# Patient Record
Sex: Male | Born: 1935 | Race: White | Hispanic: No | Marital: Married | Smoking: Former smoker
Health system: Southern US, Community
[De-identification: ages and names within clinical notes are randomized; demographics above are authoritative.]

## PROBLEM LIST (undated history)

## (undated) DIAGNOSIS — I10 Essential (primary) hypertension: Secondary | ICD-10-CM

## (undated) HISTORY — PX: HERNIA REPAIR: SHX51

## (undated) HISTORY — DX: Essential (primary) hypertension: I10

---

## 1997-09-21 ENCOUNTER — Ambulatory Visit (HOSPITAL_COMMUNITY): Admission: RE | Admit: 1997-09-21 | Discharge: 1997-09-21 | Payer: Self-pay | Admitting: Internal Medicine

## 2002-12-29 ENCOUNTER — Ambulatory Visit (HOSPITAL_COMMUNITY): Admission: RE | Admit: 2002-12-29 | Discharge: 2002-12-29 | Payer: Self-pay | Admitting: Gastroenterology

## 2002-12-29 ENCOUNTER — Encounter (INDEPENDENT_AMBULATORY_CARE_PROVIDER_SITE_OTHER): Payer: Self-pay | Admitting: Specialist

## 2006-07-21 ENCOUNTER — Encounter: Admission: RE | Admit: 2006-07-21 | Discharge: 2006-07-21 | Payer: Self-pay | Admitting: Internal Medicine

## 2006-11-27 ENCOUNTER — Ambulatory Visit (HOSPITAL_COMMUNITY): Admission: RE | Admit: 2006-11-27 | Discharge: 2006-11-27 | Payer: Self-pay | Admitting: *Deleted

## 2007-07-24 ENCOUNTER — Encounter: Admission: RE | Admit: 2007-07-24 | Discharge: 2007-07-24 | Payer: Self-pay | Admitting: Internal Medicine

## 2010-10-11 NOTE — Op Note (Signed)
NAME:  George Carey, George Carey                  ACCOUNT NO.:  [id number]   MEDICAL RECORD NO.:  [id number]          PATIENT TYPE:  AMB   LOCATION:  DAY                          FACILITY:  [facility]   PHYSICIAN:  Alfonse Ras, MD   DATE OF BIRTH:  [birth year]   DATE OF PROCEDURE:  11/27/2006  DATE OF DISCHARGE:                               OPERATIVE REPORT   PREOPERATIVE DIAGNOSIS:  Right inguinal hernia and umbilical hernia.   POSTOPERATIVE DIAGNOSIS:  Right inguinal hernia and umbilical hernia.   PROCEDURE:  Right inguinal hernia repair with mesh and umbilical hernia  repair.   ANESTHESIA:  General.   SURGEON:  Alfonse Ras, M.D.   DESCRIPTION:  The patient was taken to the operating room and placed in  a supine position. After general anesthesia was induced using laryngeal  mask, the right groin and entire abdomen were prepped and draped in a  normal sterile fashion. Using an oblique incision over the right lower  inguinal region, I dissected down to the external oblique fascia.  This  was opened along its fibers down to the external ring.  The spermatic  cord was identified, mobilized to the pubic tubercle, and a Penrose  drain was passed around it.  The cord was inspected and there was no  evidence of an indirect hernia.  The patient did have a direct hernia.  This was quite small, easily reducible, and was closed with interrupted  Surgilon sutures approximating the inguinal ligament and Cooper's  ligament to the transversalis fascia.  An onlay mesh was then placed  over it of Atrium 3 by 6 size, split and brought out lateral to the  internal ring.  This was tacked in place using running 2-0 Prolene  sutures, starting at the pubic tubercle, running along the inguinal  ligament, brought out lateral to the internal ring.  It was tacked  superiorly to the transversalis fascia.  Adequate hemostasis was  assured.  The external oblique fascia was closed with a running 3-0  Vicryl suture.   The skin was closed with staples.   Then, I turned my attention to the umbilicus. A short transverse  infraumbilical incision was made.  A very small hernia defect was found.  The hernia sac was dissected free and reduced into the abdominal cavity.  The defect, itself, was only about 1 to 1.5 cm and was closed with  interrupted 0 Surgilon sutures.  The skin was then closed with staples  after adequate hemostasis was ensured. Sterile dressings were applied.  The patient tolerated the procedure well and went to the PACU in good  condition.      Alfonse Ras, MD  Electronically Signed     KRE/MEDQ  D:  11/27/2006  T:  11/27/2006  Job:  [id number]

## 2010-10-14 NOTE — Op Note (Signed)
   NAME:  George Carey, George Carey                              ACCOUNT NO.:  [id number]   MEDICAL RECORD NO.:  [id number]                   PATIENT TYPE:  AMB   LOCATION:  ENDO                                 FACILITY:  [facility]   PHYSICIAN:  James L. Malon Kindle., M.D.          DATE OF BIRTH:  [birth year]   DATE OF PROCEDURE:  12/29/2002  DATE OF DISCHARGE:                                 OPERATIVE REPORT   PROCEDURE:  Colonoscopy and polypectomy.   MEDICATIONS:  Fentanyl 50 mcg, Versed 4 mg IV.   INDICATIONS:  Colon cancer screening.   DESCRIPTION OF PROCEDURE:  The procedure had been explained to the patient  and consent obtained.  With the patient was placed in the left lateral  decubitus position, the Olympus pediatric adjustable colonoscope was  inserted and advanced.  We were able to advance easily to the cecum.  The  ileocecal valve and appendiceal orifice seen.  The scope was withdrawn and  the cecum and ascending colon were seen well.  There were two polyps seen in  the ascending colon, about 5-10 cm apart, each 0.5 cm and sessile. Each of  these was removed with the snare and sucked through the scope.  The scope  was withdrawn.  The patient tolerated the procedure well.  No other polyps  were seen throughout the remainder of the colon.  There were no further  polyps seen.  There was no significant diverticular disease.  The patient  was monitored with low-flow oxygen with pulse oximetry and cardiac  monitoring throughout the procedure.   ASSESSMENT:  Polyps removed from the ascending colon.   PLAN:  Will check pathology.  Routine post polypectomy instructions. Will  repeat the procedure in three years.                                                James L. Malon Kindle., M.D.    Waldron Session  D:  12/29/2002  T:  12/29/2002  Job:  [id number]   cc:   Antony Madura, M.D.  [address], [address]  [place]  [place] [address]  Fax: [phone]

## 2011-03-15 LAB — URINALYSIS, ROUTINE W REFLEX MICROSCOPIC
Bilirubin Urine: NEGATIVE
Ketones, ur: NEGATIVE
Specific Gravity, Urine: 1.017
Urobilinogen, UA: 0.2
pH: 7.5

## 2011-03-15 LAB — DIFFERENTIAL
Basophils Absolute: 0
Basophils Relative: 0
Lymphocytes Relative: 31
Lymphs Abs: 2
Monocytes Absolute: 0.5

## 2011-03-15 LAB — BASIC METABOLIC PANEL
BUN: 10
CO2: 28
Chloride: 106
Creatinine, Ser: 0.86
Glucose, Bld: 145 — ABNORMAL HIGH

## 2011-03-15 LAB — CBC: MCV: 87.9

## 2011-08-31 ENCOUNTER — Ambulatory Visit
Admission: RE | Admit: 2011-08-31 | Discharge: 2011-08-31 | Disposition: A | Discharge status: Home / Self Care | Payer: Medicare Other | Source: Ambulatory Visit | Attending: Internal Medicine | Admitting: Internal Medicine

## 2011-08-31 ENCOUNTER — Other Ambulatory Visit: Payer: Self-pay | Admitting: Internal Medicine

## 2011-08-31 DIAGNOSIS — M25569 Pain in unspecified knee: Secondary | ICD-10-CM

## 2011-08-31 DIAGNOSIS — M25559 Pain in unspecified hip: Secondary | ICD-10-CM

## 2013-11-14 ENCOUNTER — Ambulatory Visit
Admission: RE | Admit: 2013-11-14 | Discharge: 2013-11-14 | Disposition: A | Discharge status: Home / Self Care | Payer: Medicare Other | Source: Ambulatory Visit | Attending: Internal Medicine | Admitting: Internal Medicine

## 2013-11-14 ENCOUNTER — Other Ambulatory Visit: Payer: Self-pay | Admitting: Internal Medicine

## 2013-11-14 DIAGNOSIS — R05 Cough: Secondary | ICD-10-CM

## 2013-11-14 DIAGNOSIS — R059 Cough, unspecified: Secondary | ICD-10-CM

## 2015-05-30 HISTORY — PX: HERNIA REPAIR: SHX51

## 2015-09-21 ENCOUNTER — Ambulatory Visit
Admission: RE | Admit: 2015-09-21 | Discharge: 2015-09-21 | Disposition: A | Discharge status: Home / Self Care | Payer: Medicare Other | Source: Ambulatory Visit | Attending: Internal Medicine | Admitting: Internal Medicine

## 2015-09-21 ENCOUNTER — Other Ambulatory Visit: Payer: Self-pay | Admitting: Internal Medicine

## 2015-09-21 DIAGNOSIS — R059 Cough, unspecified: Secondary | ICD-10-CM

## 2015-09-21 DIAGNOSIS — R05 Cough: Secondary | ICD-10-CM

## 2015-11-01 ENCOUNTER — Ambulatory Visit: Payer: Self-pay | Admitting: Surgery

## 2015-11-01 NOTE — H&P (Signed)
  History of Present Illness George Carey(George Carey K. Karinne Schmader MD; 11/01/2015 11:58 AM) Patient words: hernia.  The patient is a 82 year old male who presents with an inguinal hernia. Referred by Dr. Zenaida Deedon Carey for left inguinal hernia This is an 82 year old male in relatively good health who is status post right inguinal hernia repair with mesh and umbilical hernia repair with suture 2008 by Dr. Colin Carey. Recently the patient had some type of respiratory issues and had severe coughing. In March of this year he began noticing a bulge in his left groin with some discomfort. This bulge has enlarged. It is spontaneously reducible when he is supine. He denies any obstructive symptoms. The patient is retired but is still reasonably active. He would like to have this repaired.   Allergies (George Eversole, LPN; 1/6/10966/09/2015 04:5410:16 AM) No Known Drug Allergies 11/01/2015  Medication History (George Eversole, LPN; 0/9/81196/09/2015 14:7810:18 AM) Lisinopril (10MG  Tablet, Oral) Active. Pioglitazone HCl (15MG  Tablet, Oral) Active. Atorvastatin Calcium (40MG  Tablet, Oral) Active. Medications Reconciled    Vitals (George Eversole LPN; 2/9/56216/09/2015 30:8610:16 AM) 11/01/2015 10:15 AM Weight: 161.8 lb Height: 71in Body Surface Area: 1.93 m Body Mass Index: 22.57 kg/m  Temp.: 97.77F(Oral)  Pulse: 74 (Regular)  BP: 130/60 (Sitting, Left Arm, Standard)      Physical Exam George Carey(George Carey K. Modelle Vollmer MD; 11/01/2015 11:59 AM)  The physical exam findings are as follows: Note:WDWN in NAD HEENT: EOMI, sclera anicteric Neck: No masses, no thyromegaly Lungs: CTA bilaterally; normal respiratory effort CV: Regular rate and rhythm; no murmurs Abd: +bowel sounds, soft, non-tender, no masses GU: Bilateral descended testes, no testicular masses Well-healed right inguinal incision with no sign of recurrent hernia Visible left inguinal bulge. This is easily reducible. It enlarges with Valsalva maneuver. Ext: Well-perfused; no edema Skin: Warm,  dry; no sign of jaundice    Assessment & Plan George Carey(George Carey K. Dariella Gillihan MD; 11/01/2015 10:40 AM)  LEFT INGUINAL HERNIA (K40.90)  Current Plans Schedule for Surgery - left inguinal hernia repair with mesh. The surgical procedure has been discussed with the patient. Potential risks, benefits, alternative treatments, and expected outcomes have been explained. All of the patient's questions at this time have been answered. The likelihood of reaching the patient's treatment goal is good. The patient understand the proposed surgical procedure and wishes to proceed.  George ArmsMatthew K. Corliss Skainssuei, MD, [facility]  General/ Trauma Surgery  11/01/2015 11:59 AM

## 2016-09-22 ENCOUNTER — Ambulatory Visit (INDEPENDENT_AMBULATORY_CARE_PROVIDER_SITE_OTHER): Payer: Medicare Other | Admitting: Neurology

## 2016-09-22 ENCOUNTER — Encounter: Payer: Self-pay | Admitting: Neurology

## 2016-09-22 VITALS — BP 164/70 | HR 60 | Ht 71.0 in | Wt 164.0 lb

## 2016-09-22 DIAGNOSIS — E538 Deficiency of other specified B group vitamins: Secondary | ICD-10-CM

## 2016-09-22 DIAGNOSIS — R269 Unspecified abnormalities of gait and mobility: Secondary | ICD-10-CM | POA: Diagnosis not present

## 2016-09-22 NOTE — Patient Instructions (Signed)
   We will check blood work today and get MRI of the brain. 

## 2016-09-22 NOTE — Progress Notes (Signed)
Reason for visit: Gait disorder  Referring physician: Dr. Hilda Carey is a 83 y.o. male  History of present illness:  Mr. George Carey is an 83 year old right-handed white male with a history of hypertension who has noted onset of some problems with gait instability that began in January 2018. The patient believes that the problem began relatively suddenly. He developed some decreased hearing in the left ear, he was seen by Dr. Narda Carey from ENT who found fluid behind the eardrum and this was drained. This helped the hearing slightly, but the hearing has not returned to normal. The patient has noted intermittent problems with gait instability with a tendency to veer to the left. He has not had any falls but he feels like he is going to fall. The patient denies any dizziness or vertigo. He denies any numbness or weakness of the extremities. He has not had any headache, he denies any significant problems with neck pain or severe low back pain. He has noted some problems with memory over the last 6 months. He denies issues controlling the bowels or the bladder. He has had an increase in his blood pressure medications recently, but not at the time that the gait instability began. He is sent to this office for further evaluation.  Past Medical History:  Diagnosis Date  . High blood pressure     Past Surgical History:  Procedure Laterality Date  . HERNIA REPAIR Left 2017  . HERNIA REPAIR Right Around 2011    History reviewed. No pertinent family history.  Social history:  does not have a smoking history on file. He has never used smokeless tobacco. His alcohol and drug histories are not on file.  Medications:  Prior to Admission medications   Medication Sig Start Date End Date Taking? Authorizing Provider  atorvastatin (LIPITOR) 40 MG tablet Take 40 mg by mouth daily. 03/31/14  Yes Historical Provider, MD  hydrochlorothiazide (MICROZIDE) 12.5 MG capsule Take 12.5 mg by mouth daily.   08/29/16  Yes Historical Provider, MD  lisinopril (PRINIVIL,ZESTRIL) 10 MG tablet Take 20 mg by mouth daily.  06/16/16  Yes Historical Provider, MD  pioglitazone (ACTOS) 15 MG tablet Take 15 mg by mouth daily.  04/06/14  Yes Historical Provider, MD     Not on File  ROS:  Out of a complete 14 system review of symptoms, the patient complains only of the following symptoms, and all other reviewed systems are negative.  Hearing loss Snoring  Blood pressure (!) 164/70, pulse 60, height  (1.803 m), weight 164 lb (74.4 kg).   Blood pressure, right arm, sitting is 188/88. Blood pressure, right arm, standing is 142/80.   Physical Exam  General: The patient is alert and cooperative at the time of the examination.  Eyes: Pupils are equal, round, and reactive to light. Discs are flat bilaterally.  Neck: The neck is supple, no carotid bruits are noted.  Respiratory: The respiratory examination is clear.  Cardiovascular: The cardiovascular examination reveals a regular rate and rhythm, no obvious murmurs or rubs are noted.  Skin: Extremities are without significant edema.  Neurologic Exam  Mental status: The patient is alert and oriented x 3 at the time of the examination. The patient has apparent normal recent and remote memory, with an apparently normal attention span and concentration ability.  Cranial nerves: Facial symmetry is present. There is good sensation of the face to pinprick and soft touch bilaterally. The strength of the facial muscles and  the muscles to head turning and shoulder shrug are normal bilaterally. Speech is well enunciated, no aphasia or dysarthria is noted. Extraocular movements are full. Visual fields are full. The tongue is midline, and the patient has symmetric elevation of the soft palate. No obvious hearing deficits are noted.  Motor: The motor testing reveals 5 over 5 strength of all 4 extremities. Good symmetric motor tone is noted throughout.  Sensory:  Sensory testing is intact to pinprick, soft touch, vibration sensation, and position sense on all 4 extremities. No evidence of extinction is noted.  Coordination: Cerebellar testing reveals good finger-nose-finger and heel-to-shin bilaterally.  Gait and station: Gait is normal. Tandem gait is normal. Romberg is negative. No drift is seen.  Reflexes: Deep tendon reflexes are symmetric and normal bilaterally. Toes are downgoing bilaterally.   Assessment/Plan:  1. Reported gait disturbance  2. Hypertension  3. Reported memory disturbance  The patient appears to have a relatively normal gait while examined today. He indicates that the tendency to veer to the left is intermittent in nature. The patient does drop over 45 points from sitting to standing, but he is significantly hypertensive. The patient will be set up for blood work today, he will have MRI evaluation of the brain, and he will follow-up in about 4 months. The patient could potentially benefit from physical therapy in the future.  George Palau MD 09/22/2016 11:32 AM  [facility] [address] [place], [place] [address]  Phone [phone] Fax [phone]

## 2016-09-23 LAB — RPR: RPR Ser Ql: NONREACTIVE

## 2016-09-23 LAB — SEDIMENTATION RATE: SED RATE: 2 mm/h (ref 0–30)

## 2016-09-23 LAB — VITAMIN B12: VITAMIN B 12: 674 pg/mL (ref 232–1245)

## 2016-10-04 ENCOUNTER — Ambulatory Visit
Admission: RE | Admit: 2016-10-04 | Discharge: 2016-10-04 | Disposition: A | Discharge status: Home / Self Care | Payer: Medicare Other | Source: Ambulatory Visit | Attending: Neurology | Admitting: Neurology

## 2016-10-04 DIAGNOSIS — R269 Unspecified abnormalities of gait and mobility: Secondary | ICD-10-CM

## 2016-10-08 ENCOUNTER — Telehealth: Payer: Self-pay | Admitting: Neurology

## 2016-10-08 NOTE — Telephone Encounter (Signed)
I called the patient. The MRI of the brain shows minimal SVD, nothing that would explain the gait disorder. ? If physical therapy would help. The patient is to let me know if he wishes to have this.  MRI brain 10/06/16:  IMPRESSION: Abnormal MRI scan of the brain showing mild changes of age-related chronic microvascular ischemia and moderate degree of generalized cerebral atrophy.

## 2016-11-15 IMAGING — CR DG CHEST 2V
2 series · 2 of 2 positions shown · non-contrast
Comparison: 11/14/13

CLINICAL DATA: Occasionally productive cough for several months

EXAM:
CHEST  2 VIEW

[w chest pa]
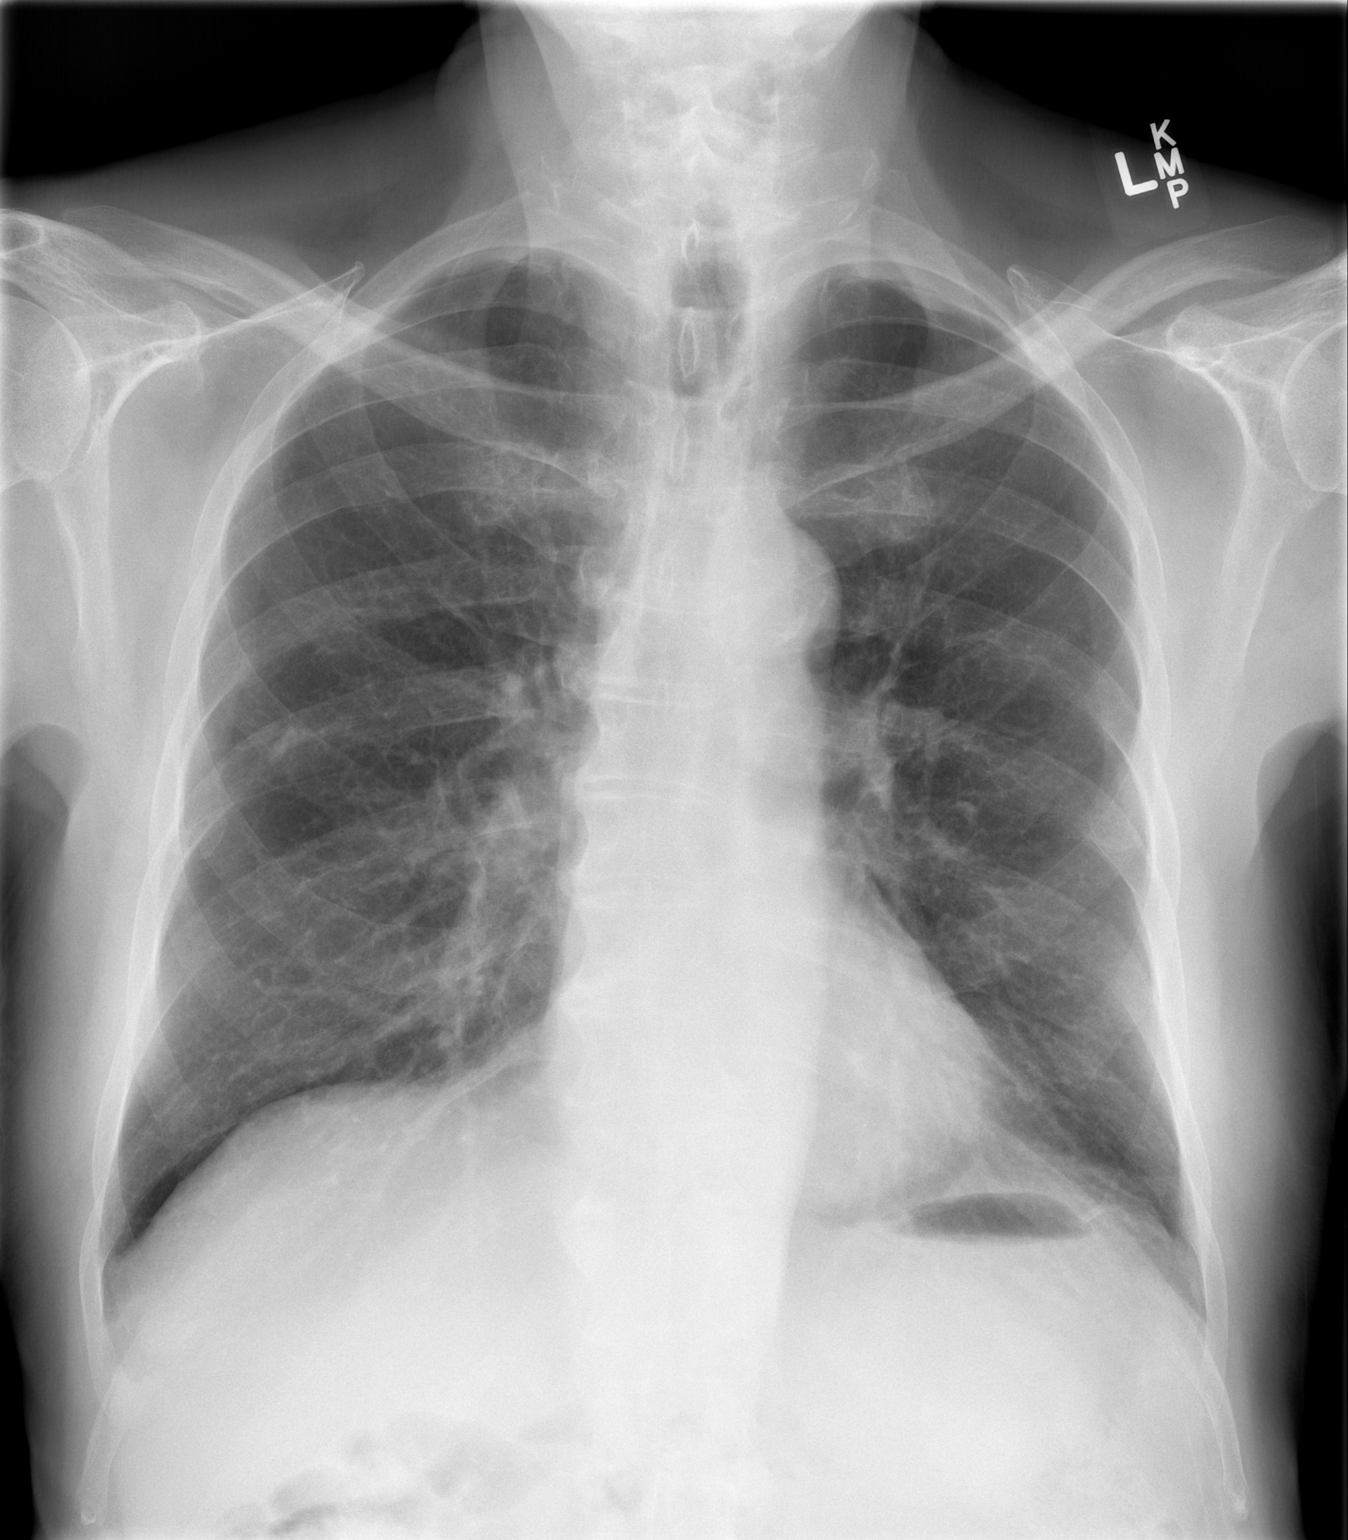

[w chest lat]
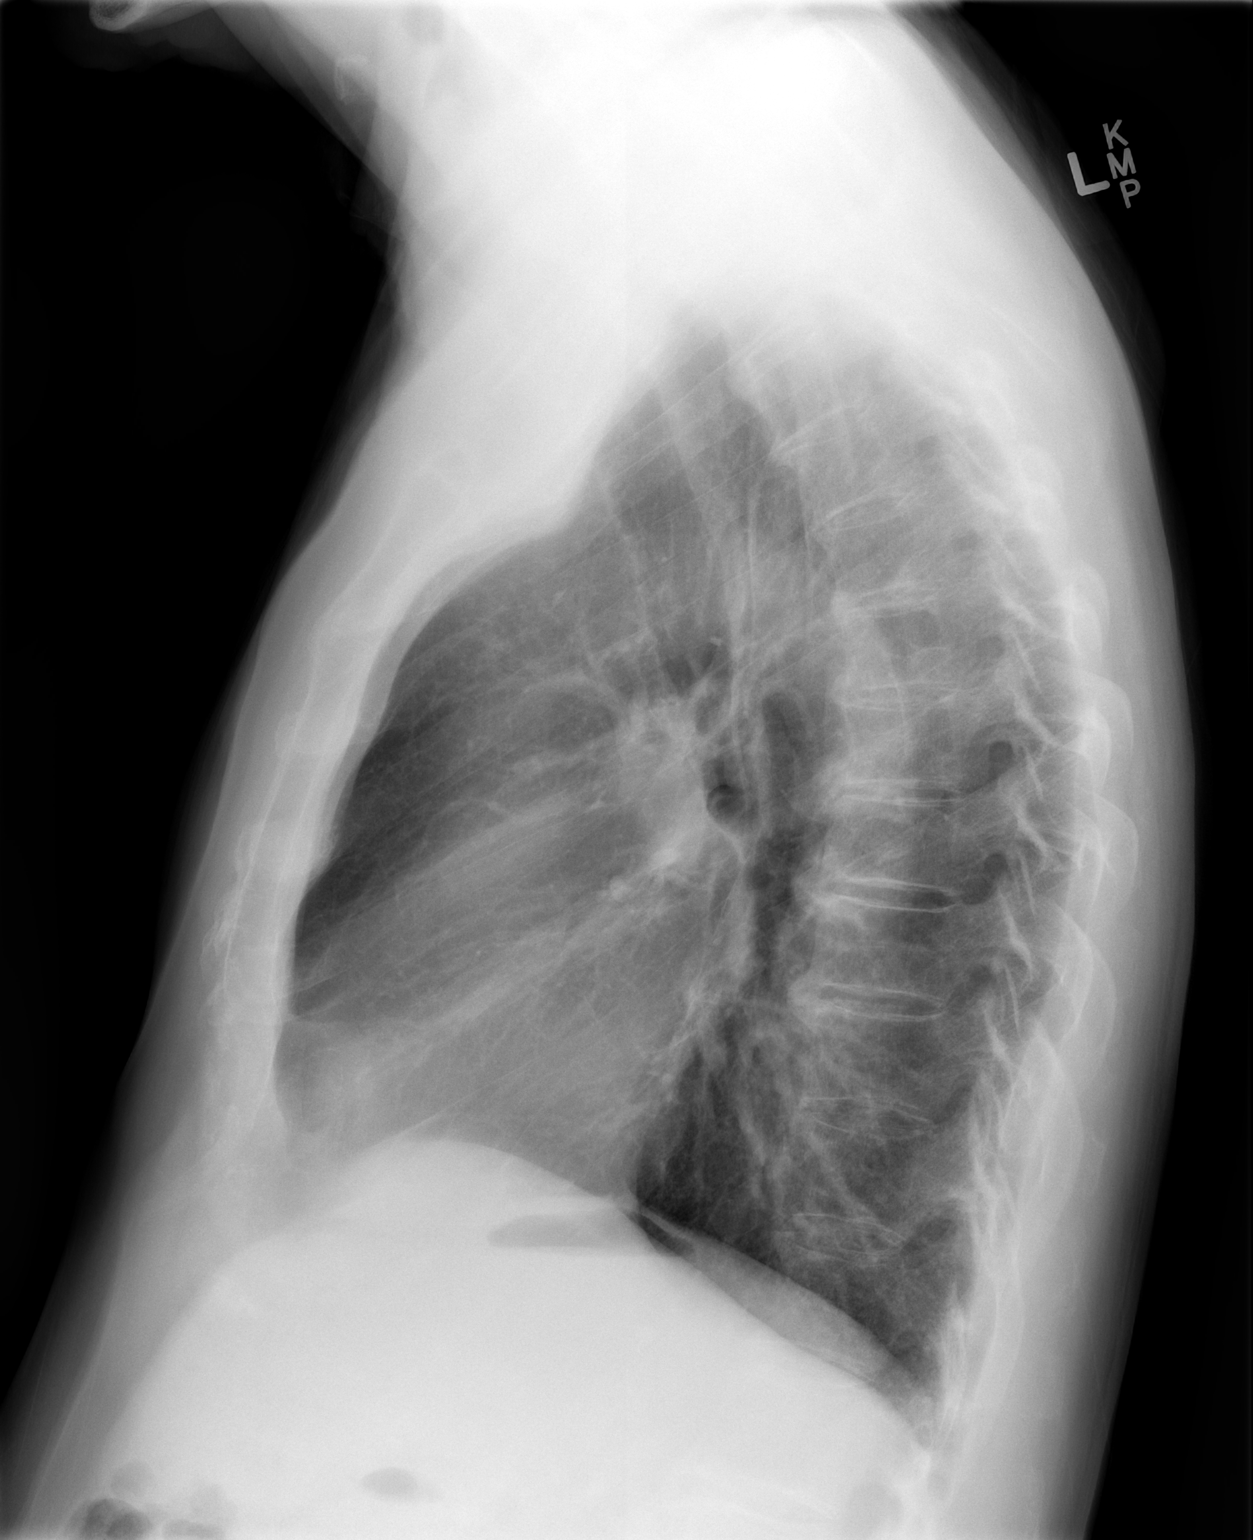

[2 of 2 positions shown; findings below may reference images not displayed]

FINDINGS: The heart size and mediastinal contours are within normal limits.
Both lungs are clear. The visualized skeletal structures are
unremarkable.
IMPRESSION: No active cardiopulmonary disease.

## 2017-02-07 ENCOUNTER — Ambulatory Visit: Payer: Medicare Other | Admitting: Neurology

## 2019-06-20 ENCOUNTER — Ambulatory Visit: Payer: Medicare Other | Attending: Internal Medicine

## 2019-06-20 DIAGNOSIS — Z23 Encounter for immunization: Secondary | ICD-10-CM | POA: Insufficient documentation

## 2019-06-20 NOTE — Progress Notes (Signed)
   Covid-19 Vaccination Clinic  Name:  George Carey    MRN: [id number] DOB: Apr 29, 1933  06/20/2019  George Carey was observed post Covid-19 immunization for 15 minutes without incidence. He was provided with Vaccine Information Sheet and instruction to access the V-Safe system.   George Carey was instructed to call 911 with any severe reactions post vaccine: Marland Kitchen Difficulty breathing  . Swelling of your face and throat  . A fast heartbeat  . A bad rash all over your body  . Dizziness and weakness    Immunizations Administered    Name Date Dose VIS Date Route   Pfizer COVID-19 Vaccine 06/20/2019  1:10 PM 0.3 mL 05/09/2019 Intramuscular   Manufacturer: [employer], [employer]   Lot: [id number]   NDC: [address]-3

## 2019-07-12 ENCOUNTER — Ambulatory Visit: Payer: Medicare Other | Attending: Internal Medicine

## 2019-07-12 DIAGNOSIS — Z23 Encounter for immunization: Secondary | ICD-10-CM | POA: Insufficient documentation

## 2019-07-12 NOTE — Progress Notes (Signed)
   Covid-19 Vaccination Clinic  Name:  George Carey    MRN: [id number] DOB: 26-Jan-1933  07/12/2019  [name] was observed post Covid-19 immunization for 15 minutes without incidence. He was provided with Vaccine Information Sheet and instruction to access the V-Safe system.   [name] was instructed to call 911 with any severe reactions post vaccine: Marland Kitchen Difficulty breathing  . Swelling of your face and throat  . A fast heartbeat  . A bad rash all over your body  . Dizziness and weakness    Immunizations Administered    Name Date Dose VIS Date Route   Pfizer COVID-19 Vaccine 07/12/2019 10:40 AM 0.3 mL 05/09/2019 Intramuscular   Manufacturer: [employer], [employer]   Lot: [id number]   NDC: [address]-6

## 2020-03-13 ENCOUNTER — Ambulatory Visit: Payer: Medicare Other | Attending: Internal Medicine

## 2020-03-13 DIAGNOSIS — Z23 Encounter for immunization: Secondary | ICD-10-CM

## 2020-03-13 NOTE — Progress Notes (Signed)
   Covid-19 Vaccination Clinic  Name:  George Carey    MRN: [id number] DOB: 06/01/1932  03/13/2020  [name] was observed post Covid-19 immunization for 15 minutes without incident. He was provided with Vaccine Information Sheet and instruction to access the V-Safe system.   [name] was instructed to call 911 with any severe reactions post vaccine: Marland Kitchen Difficulty breathing  . Swelling of face and throat  . A fast heartbeat  . A bad rash all over body  . Dizziness and weakness

## 2022-07-11 ENCOUNTER — Other Ambulatory Visit: Payer: Self-pay | Admitting: Otolaryngology

## 2022-07-11 DIAGNOSIS — H903 Sensorineural hearing loss, bilateral: Secondary | ICD-10-CM

## 2022-07-11 DIAGNOSIS — H81392 Other peripheral vertigo, left ear: Secondary | ICD-10-CM

## 2022-08-03 ENCOUNTER — Ambulatory Visit
Admission: RE | Admit: 2022-08-03 | Discharge: 2022-08-03 | Disposition: A | Discharge status: Home / Self Care | Payer: Medicare Other | Source: Ambulatory Visit | Attending: Otolaryngology | Admitting: Otolaryngology

## 2022-08-03 DIAGNOSIS — H81392 Other peripheral vertigo, left ear: Secondary | ICD-10-CM

## 2022-08-03 DIAGNOSIS — H903 Sensorineural hearing loss, bilateral: Secondary | ICD-10-CM

## 2022-08-03 MED ORDER — GADOPICLENOL 0.5 MMOL/ML IV SOLN
7.0000 mL | Freq: Once | INTRAVENOUS | Status: AC | PRN
  Administered 2022-08-03: 7 mL via INTRAVENOUS
